# Patient Record
Sex: Male | Born: 2005 | Race: White | Hispanic: No | Marital: Single | Smoking: Never smoker
Health system: Southern US, Community
[De-identification: ages and names within clinical notes are randomized; demographics above are authoritative.]

## PROBLEM LIST (undated history)

## (undated) DIAGNOSIS — S6991XD Unspecified injury of right wrist, hand and finger(s), subsequent encounter: Secondary | ICD-10-CM

---
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
## 2023-10-16 ENCOUNTER — Other Ambulatory Visit (HOSPITAL_COMMUNITY): Payer: Self-pay
